# Patient Record
Sex: Male | Born: 1950 | Race: Black or African American | Hispanic: No | Marital: Single | Smoking: Never smoker
Health system: Southern US, Community
[De-identification: ages and names within clinical notes are randomized; demographics above are authoritative.]

## PROBLEM LIST (undated history)

## (undated) DIAGNOSIS — I10 Essential (primary) hypertension: Secondary | ICD-10-CM

## (undated) HISTORY — PX: PROSTATOTOMY: SUR1057

---

## 2005-03-20 ENCOUNTER — Emergency Department: Payer: Self-pay | Admitting: Emergency Medicine

## 2005-12-06 ENCOUNTER — Other Ambulatory Visit: Payer: Self-pay

## 2005-12-06 ENCOUNTER — Emergency Department: Payer: Self-pay | Admitting: Emergency Medicine

## 2005-12-07 ENCOUNTER — Other Ambulatory Visit: Payer: Self-pay

## 2008-07-26 ENCOUNTER — Emergency Department: Payer: Self-pay | Admitting: Emergency Medicine

## 2008-08-27 ENCOUNTER — Emergency Department: Payer: Self-pay | Admitting: Unknown Physician Specialty

## 2008-09-15 ENCOUNTER — Emergency Department: Payer: Self-pay | Admitting: Emergency Medicine

## 2008-09-17 ENCOUNTER — Emergency Department: Payer: Self-pay | Admitting: Emergency Medicine

## 2009-05-11 ENCOUNTER — Emergency Department: Payer: Self-pay | Admitting: Emergency Medicine

## 2009-05-30 ENCOUNTER — Emergency Department: Payer: Self-pay | Admitting: Emergency Medicine

## 2009-10-23 ENCOUNTER — Inpatient Hospital Stay: Payer: Self-pay | Admitting: Internal Medicine

## 2011-12-29 IMAGING — CT CT HEAD WITHOUT CONTRAST
2 series · 15 of 30 positions shown, 19 images · non-contrast
Comparison: none

REASON FOR EXAM: SYNCOPE
COMMENTS:   May transport without cardiac monitor

[Series 2: without · axial · non-contrast · 0.46mm/px · z∈[-180,-50]mm · 13 of 32 slices shown, 17 images]
[im 3/32  brain]
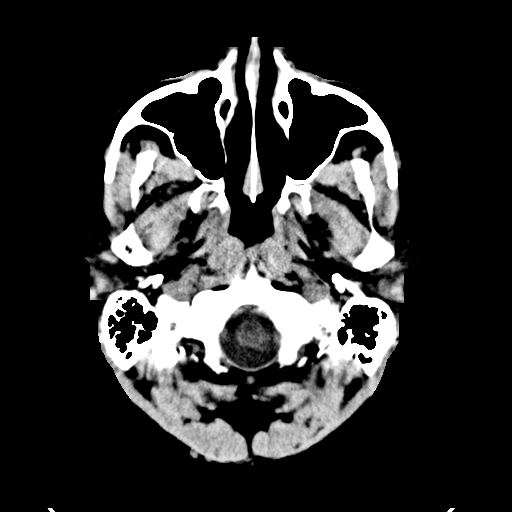
[im 3/32  bone]
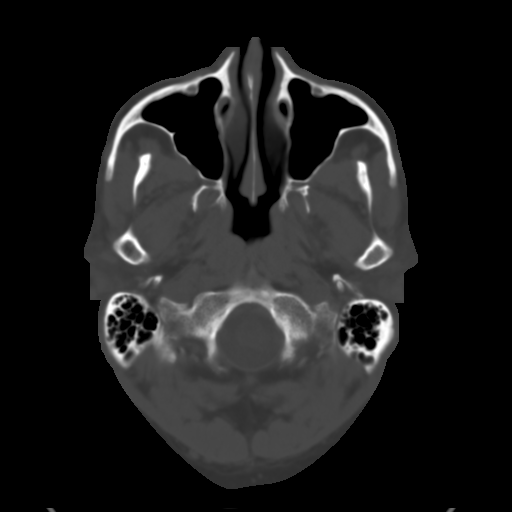
[im 5/32  brain]
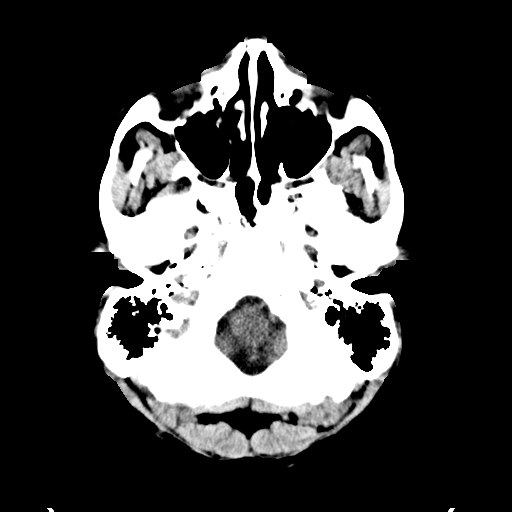
[im 7/32  brain]
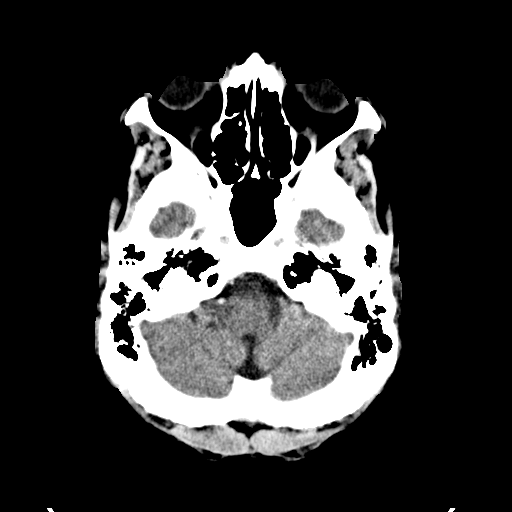
[im 9/32  brain]
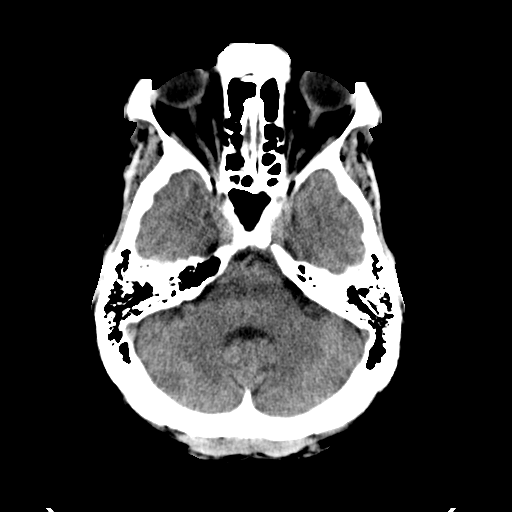
[im 12/32  brain]
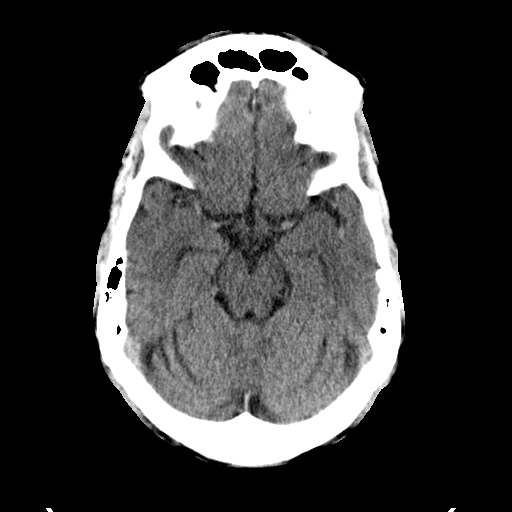
[im 12/32  bone]
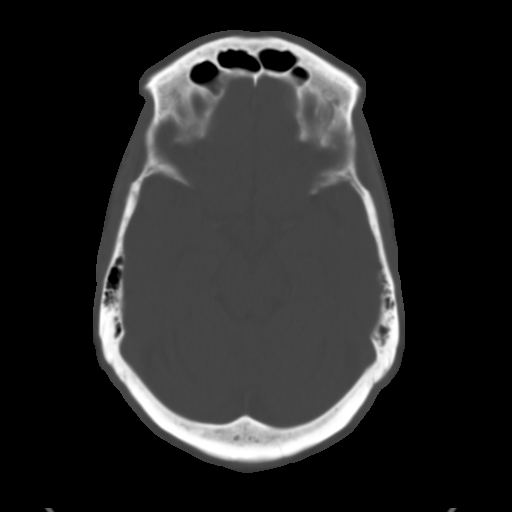
[im 14/32  brain]
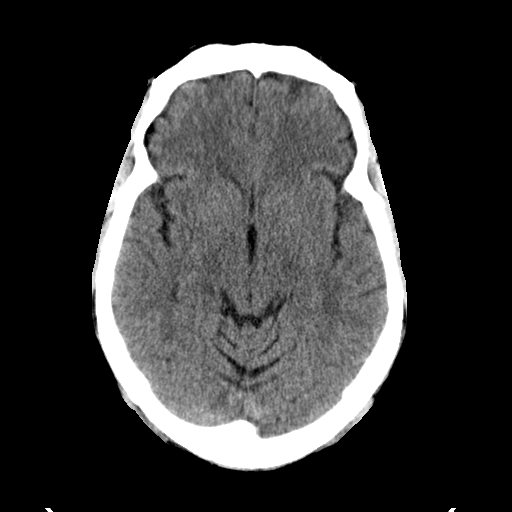
[im 16/32  brain]
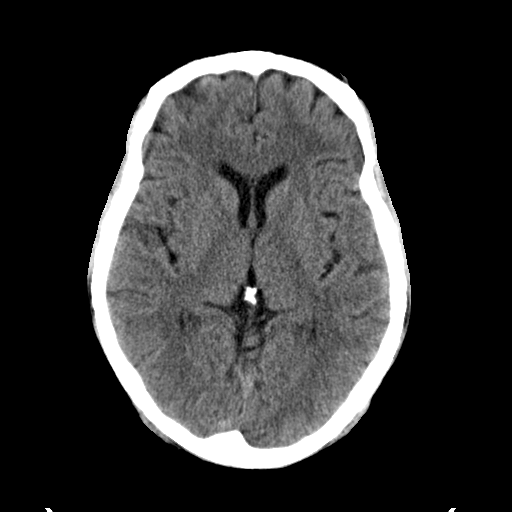
[im 18/32  brain]
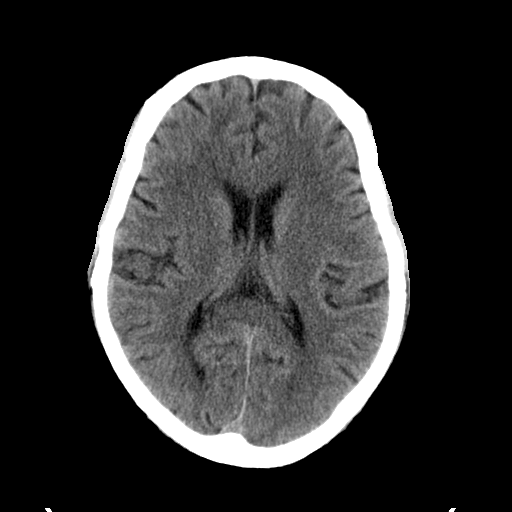
[im 20/32  brain]
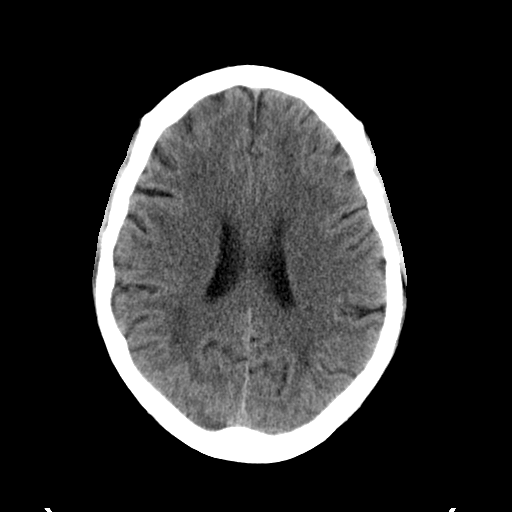
[im 20/32  bone]
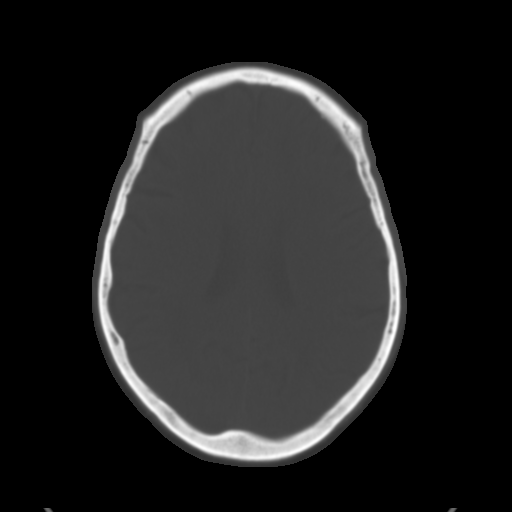
[im 23/32  brain]
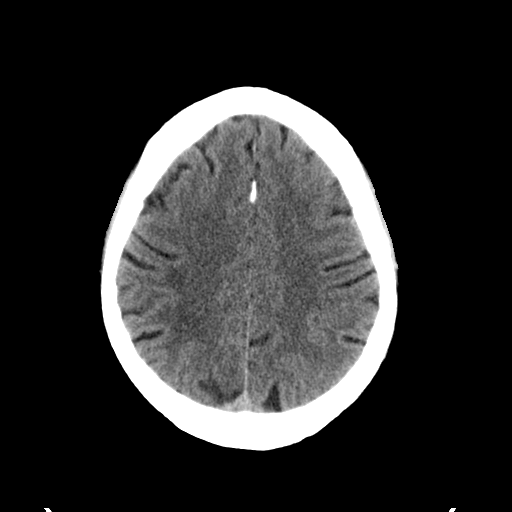
[im 25/32  brain]
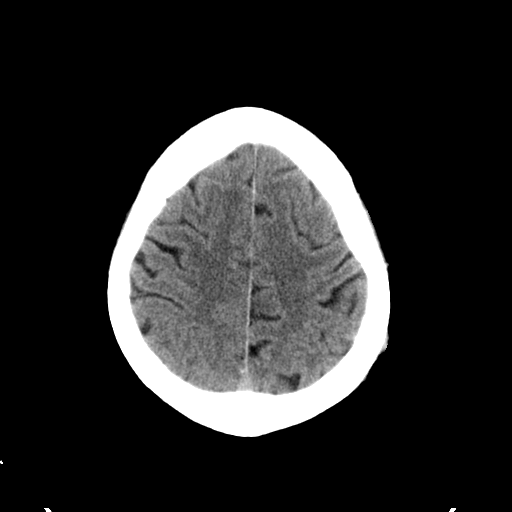
[im 27/32  brain]
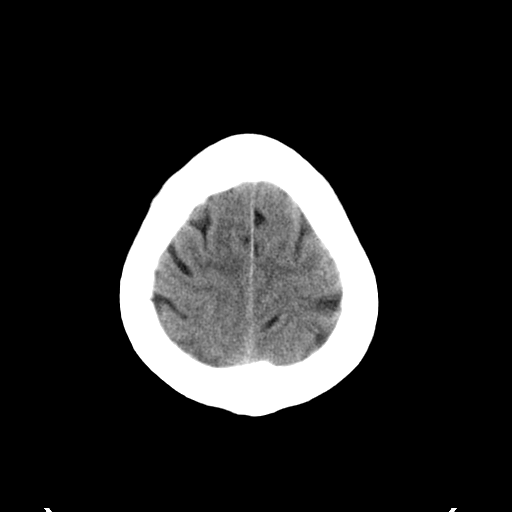
[im 29/32  brain]
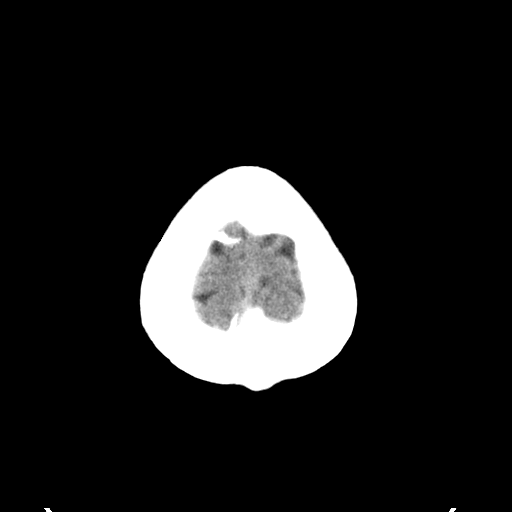
[im 29/32  bone]
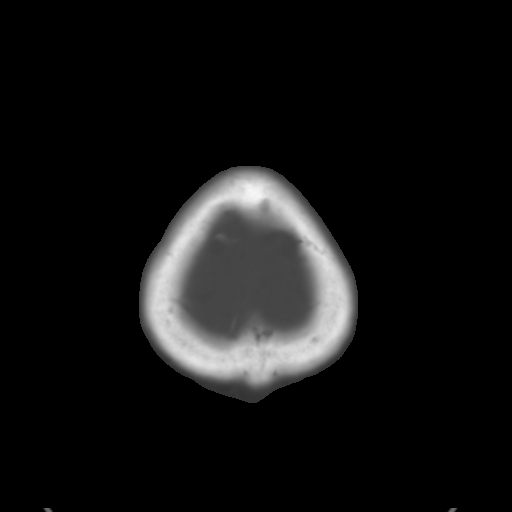

[Series 3: bone · axial · 0.46mm/px · z∈[-180,-160]mm · 2 of 32 slices shown]
[im 3/32  bone]
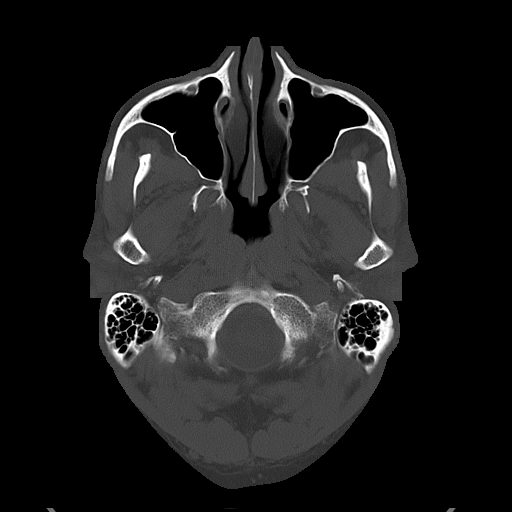
[im 7/32  bone]
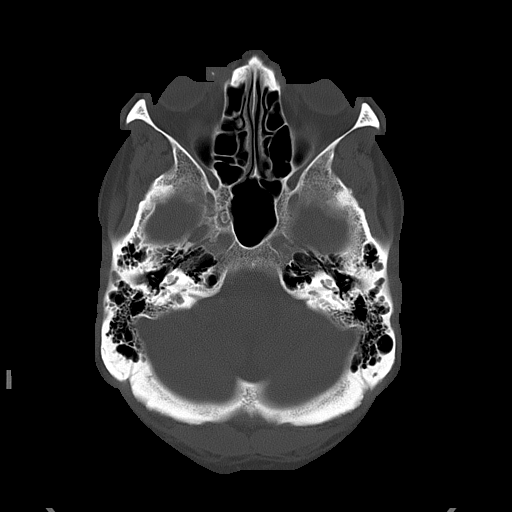

[15 of 30 positions shown; findings below may reference images not displayed]

PROCEDURE:     CT  - CT HEAD WITHOUT CONTRAST  - October 22, 2009 [DATE]

RESULT:     Axial noncontrast CT scanning was performed through the brain at
5 mm intervals and slice thicknesses.

The ventricles are normal in size and position. There is subtle decreased
density in the deep white matter of both posterior parietal lobes consistent
with small vessel ischemic type change. Milder but similar changes are noted
in the frontal deep white matter. There is no intracranial hemorrhage nor
intracranial mass effect. There are no findings to suggest an evolving
ischemic infarction. At bone window settings the observed portions of the
paranasal sinuses and mastoid air cells are clear. I do not see evidence of
an acute skull fracture.
IMPRESSION: 1. There is no evidence of an acute intracranial hemorrhage.
2. There is decreased density in the deep white matter of both cerebral
hemispheres as described consistent with chronic small vessel ischemic type
change.
3. I do not see evidence of an acute skull fracture nor other acute
abnormality.

## 2012-01-01 IMAGING — US US CAROTID DUPLEX BILAT
1 series · 17 of 24 positions shown · non-contrast
Comparison: none

REASON FOR EXAM: syncope
COMMENTS:

[Series 1: us carotid duplex bilat · 17 of 59 slices shown]
[im 1/59]
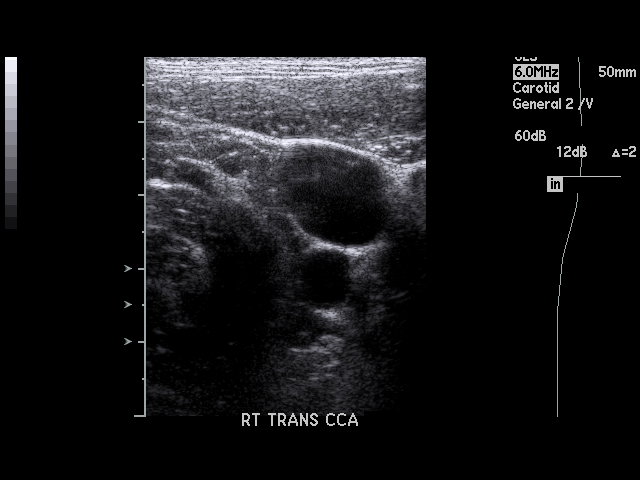
[im 6/59]
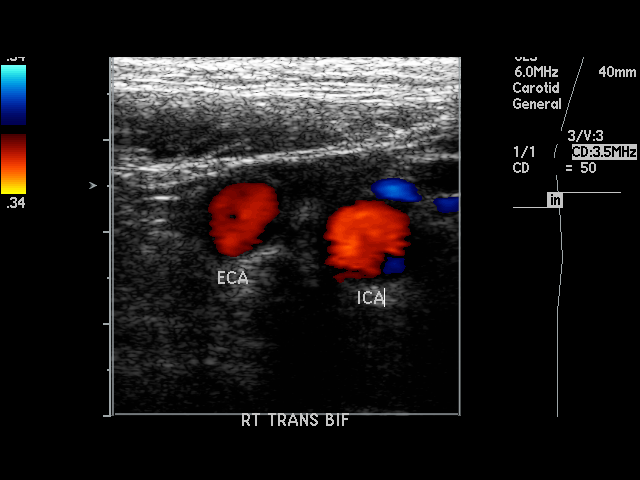
[im 8/59]
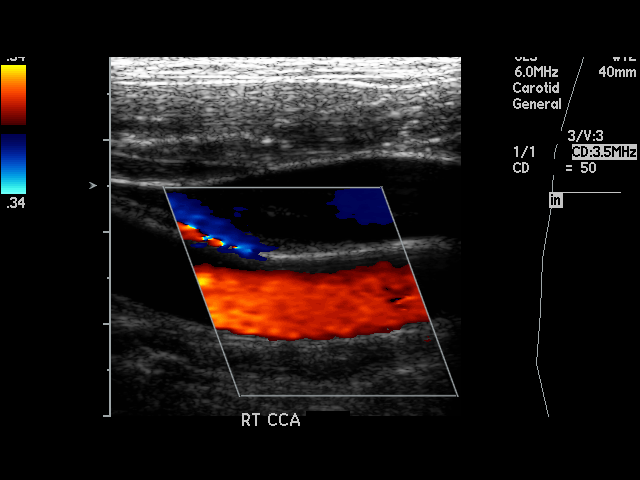
[im 11/59]
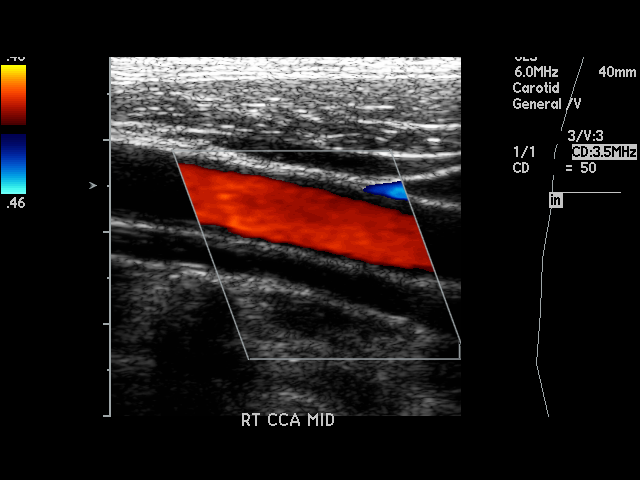
[im 16/59]
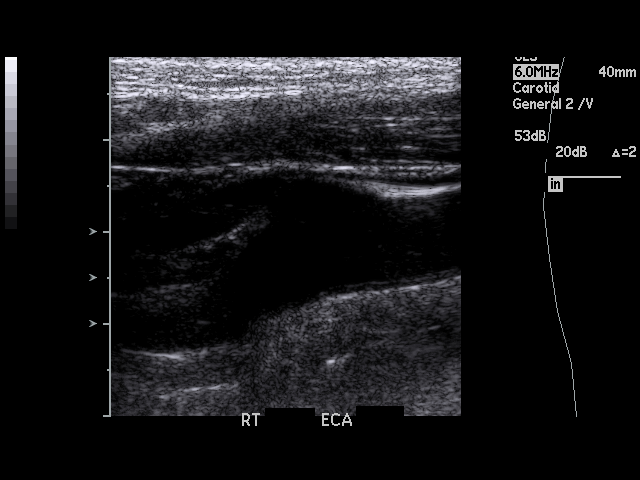
[im 18/59]
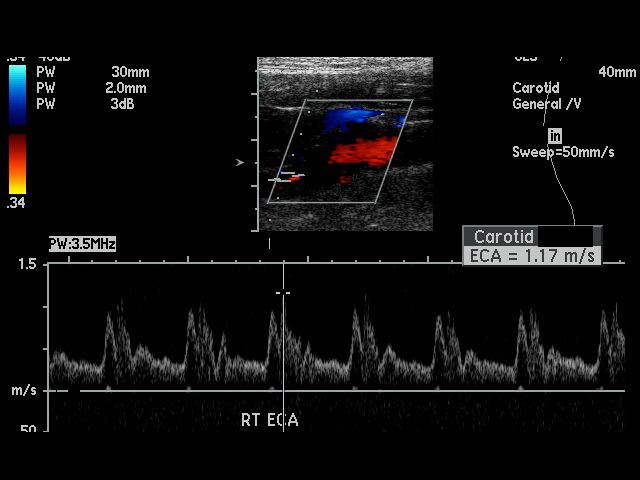
[im 23/59]
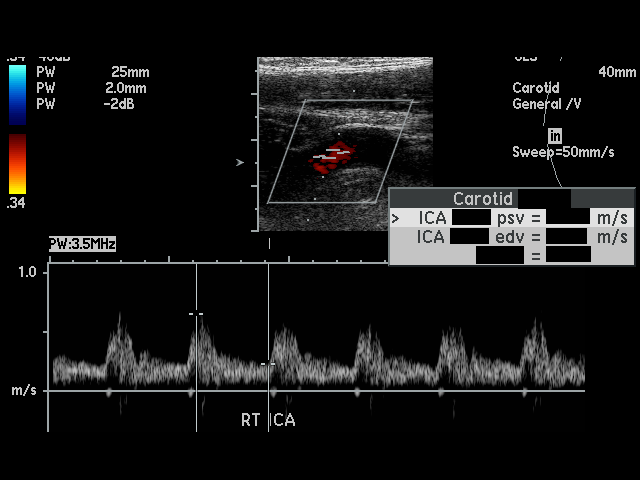
[im 26/59]
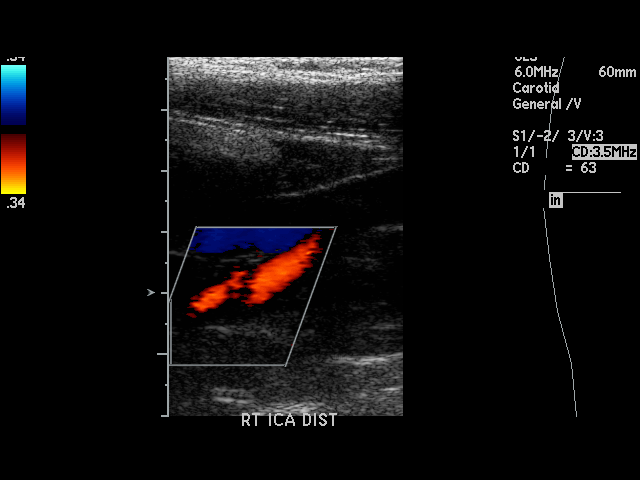
[im 31/59]
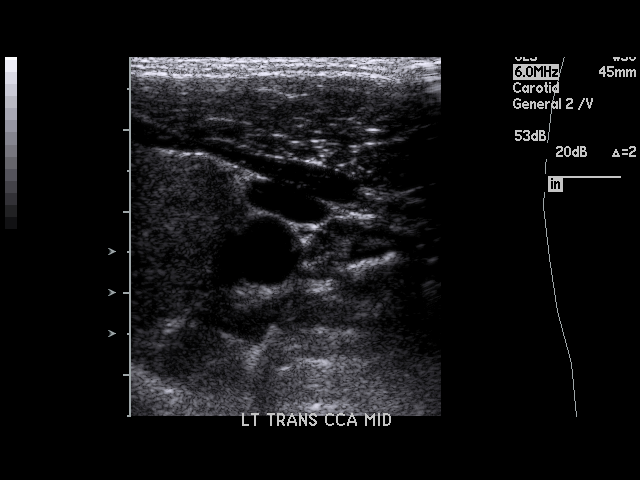
[im 33/59]
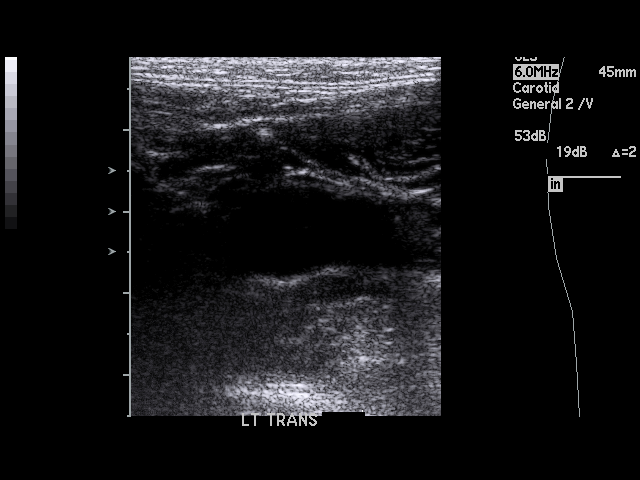
[im 36/59]
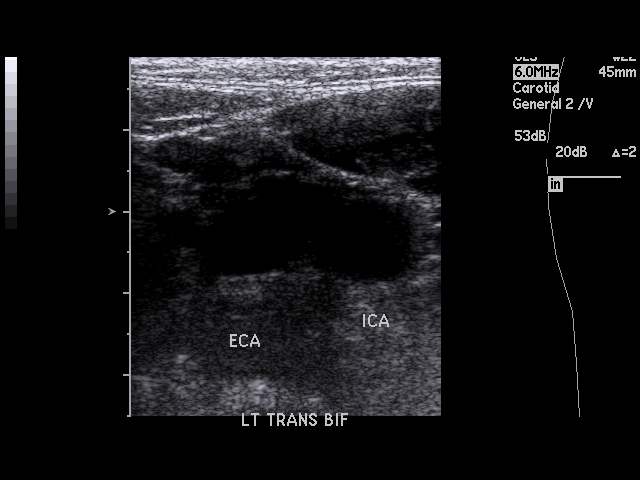
[im 41/59]
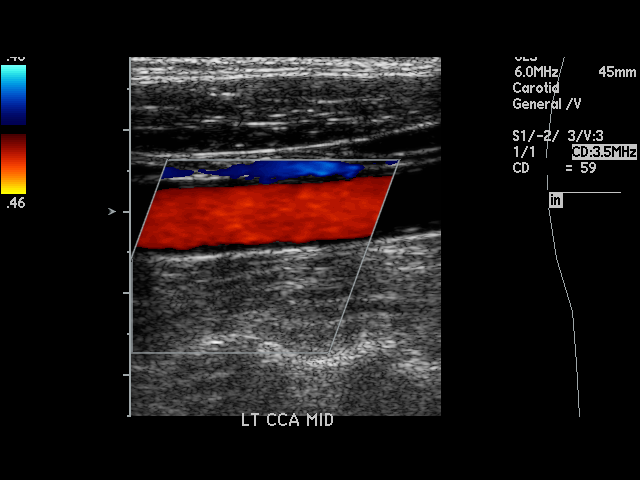
[im 43/59]
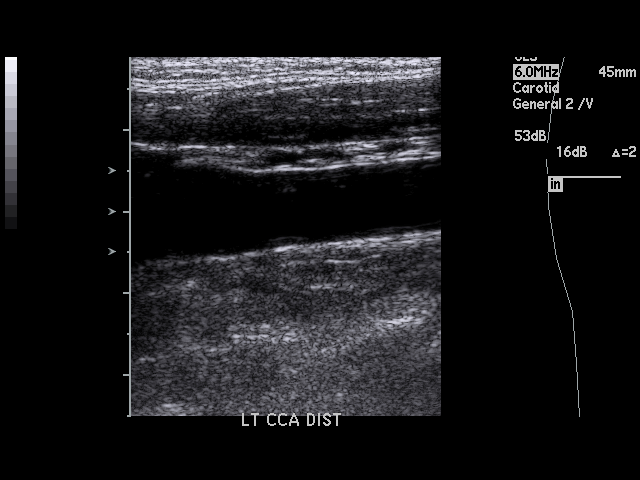
[im 48/59]
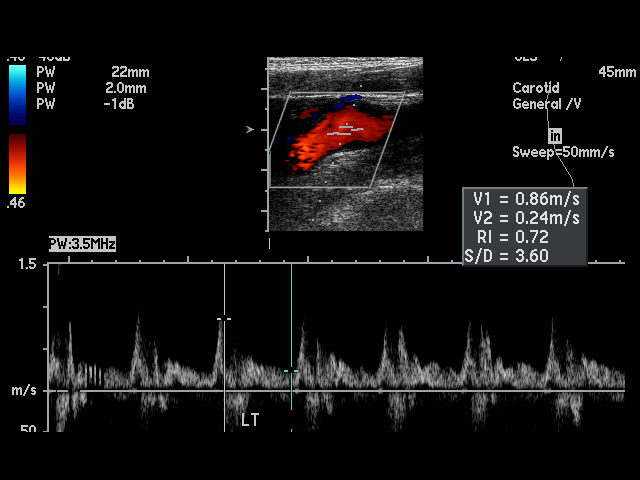
[im 51/59]
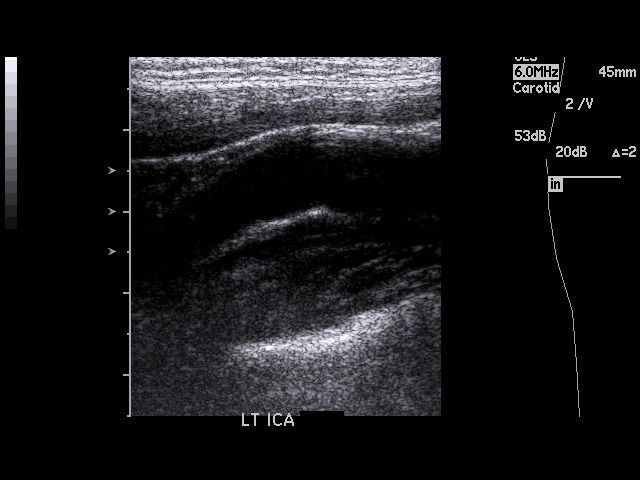
[im 53/59]
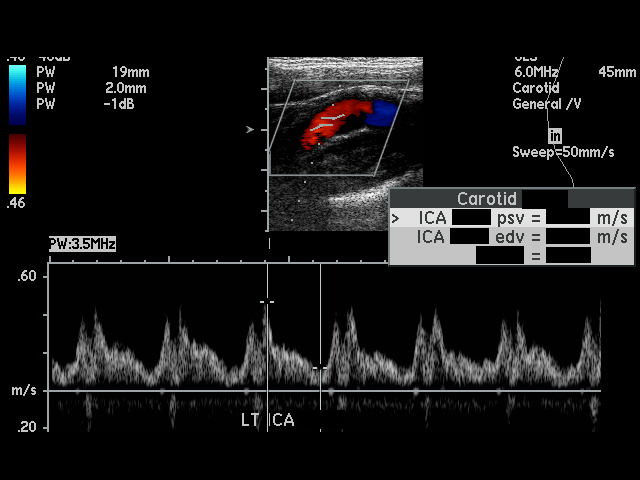
[im 59/59]
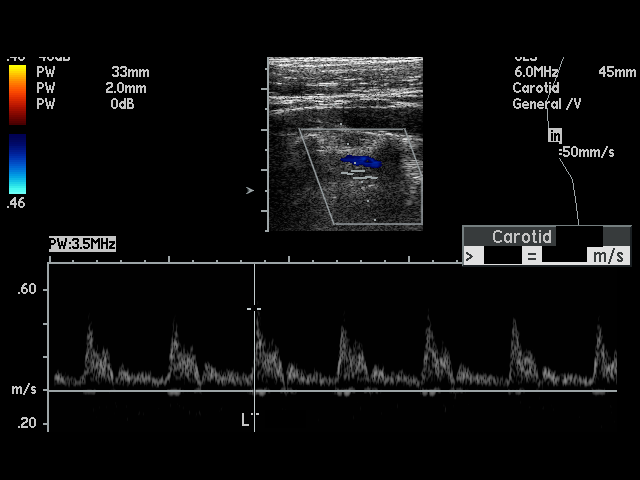

[17 of 24 positions shown; findings below may reference images not displayed]

PROCEDURE:     US  - US CAROTID DOPPLER BILATERAL  - October 25, 2009 [DATE]

RESULT:     Neither the right nor left carotid systems demonstrate calcified
or soft plaque. The color flow images and the waveform patterns are normal.
On the right peak internal carotid systolic velocity measured 77 cm per
second and peak common carotid velocity measured 104 cm per second
corresponding to a normal ratio of 0.7. On the left peak internal carotid
systolic velocity measured 72 cm per second and peak common carotid velocity
measured 125 cm per second corresponding to a ratio of 0.6. The vertebral
arteries are normal in flow direction bilaterally.
IMPRESSION: Normal carotid Doppler ultrasound examination.

## 2013-12-02 ENCOUNTER — Encounter (HOSPITAL_COMMUNITY): Payer: Self-pay | Admitting: Emergency Medicine

## 2013-12-02 DIAGNOSIS — N453 Epididymo-orchitis: Secondary | ICD-10-CM | POA: Insufficient documentation

## 2013-12-02 DIAGNOSIS — I10 Essential (primary) hypertension: Secondary | ICD-10-CM | POA: Insufficient documentation

## 2013-12-02 DIAGNOSIS — R509 Fever, unspecified: Secondary | ICD-10-CM | POA: Insufficient documentation

## 2013-12-02 NOTE — ED Notes (Signed)
Patient states his testicles have been swollen x 2 days.

## 2013-12-02 NOTE — ED Notes (Signed)
[facility].

## 2013-12-03 ENCOUNTER — Emergency Department (HOSPITAL_COMMUNITY)
Admission: EM | Admit: 2013-12-03 | Discharge: 2013-12-03 | Disposition: A | Discharge status: Home / Self Care | Attending: Emergency Medicine | Admitting: Emergency Medicine

## 2013-12-03 DIAGNOSIS — N451 Epididymitis: Secondary | ICD-10-CM

## 2013-12-03 HISTORY — DX: Essential (primary) hypertension: I10

## 2013-12-03 MED ORDER — CIPROFLOXACIN HCL 250 MG PO TABS
500.0000 mg | ORAL_TABLET | Freq: Two times a day (BID) | ORAL | Status: DC
  Administered 2013-12-03: 500 mg via ORAL
  Filled 2013-12-03: qty 2

## 2013-12-03 MED ORDER — OXYCODONE-ACETAMINOPHEN 5-325 MG PO TABS: 1.0000 | ORAL_TABLET | ORAL | Status: AC | PRN

## 2013-12-03 MED ORDER — OXYCODONE-ACETAMINOPHEN 5-325 MG PO TABS
1.0000 | ORAL_TABLET | Freq: Once | ORAL | Status: AC
  Administered 2013-12-03: 1 via ORAL
  Filled 2013-12-03: qty 1

## 2013-12-03 NOTE — ED Provider Notes (Signed)
CSN: [id number]     Arrival date & time 12/02/13  2304 History   First MD Initiated Contact with Patient 12/03/13 0255     Chief Complaint  Patient presents with  . Groin Swelling     (Consider location/radiation/quality/duration/timing/severity/associated sxs/prior Treatment) The history is provided by the patient.   63 year old male comes in with a painful swelling in his left testicle for the last 2 days. He has had subjective fever but no chills or sweats. There's been no dysuria. There is no radiation of pain in his abdomen or back. Pain is rated at 7/10. [facility] and is scheduled for surgery later today for this problem. He states that he had a small nodule in the testicle which have been treated but now the entire testicle is swollen.  Past Medical History  Diagnosis Date  . Hypertension    Past Surgical History  Procedure Laterality Date  . Prostatotomy     No family history on file. History  Substance Use Topics  . Smoking status: Never Smoker   . Smokeless tobacco: Not on file  . Alcohol Use: No    Review of Systems  All other systems reviewed and are negative.     Allergies  Aspirin  Home Medications   Current Outpatient Rx  Name  Route  Sig  Dispense  Refill  . oxyCODONE-acetaminophen (PERCOCET/ROXICET) 5-325 MG per tablet   Oral   Take 1 tablet by mouth every 4 (four) hours as needed for severe pain.   6 tablet   0    BP 133/68  Pulse 95  Temp(Src) 99.7 F (37.6 C) (Oral)  Resp 20  Ht 6\' 2"  (1.88 m)  Wt 235 lb (106.595 kg)  BMI 30.16 kg/m2  SpO2 99% Physical Exam  Nursing note and vitals reviewed.  63 year old male, resting comfortably and in no acute distress. Vital signs are normal. Oxygen saturation is 99%, which is normal. Head is normocephalic and atraumatic. PERRLA, EOMI. Oropharynx is clear. Neck is nontender and supple without adenopathy or JVD. Back is nontender and there is no CVA  tenderness. Lungs are clear without rales, wheezes, or rhonchi. Chest is nontender. Heart has regular rate and rhythm without murmur. Abdomen is soft, flat, nontender without masses or hepatosplenomegaly and peristalsis is normoactive. Genitalia: Circumcised penis. Right testicle is normal. Left testicle is indurated and moderately enlarged and very tender consistent with epididymitis. No other scrotal masses are seen. There is no inguinal adenopathy Extremities have no cyanosis or edema, full range of motion is present. Skin is warm and dry without rash. Neurologic: Mental status is normal, cranial nerves are intact, there are no motor or sensory deficits.  ED Course  Procedures (including critical care time)  MDM   Final diagnoses:  Epididymitis, left    Epididymitis of the left testicle. [facility] and he was seen in the urology clinic one month ago and treated for epididymitis with trimethoprim-sulfamethoxazole. At that time, followup appointment was set up for 3 months. Patient states that he got called requesting that the apartment be moved up and he has a scheduled appointment at 10:30 AM later today. If clinical picture of epididymitis and urology appointment scheduled for 8 hours from now, I do not see indication for any ED workup. He is given a dose of ciprofloxacin and is discharged with prescription for oxycodone-acetaminophen for pain in the us to see the urologist at 10:30 AM  as scheduled.    Dione Booze, MD 12/03/13 [phone]

## 2013-12-03 NOTE — Discharge Instructions (Signed)
[facility] your [facility].   Epididymitis Epididymitis is a swelling (inflammation) of the epididymis. The epididymis is a cord-like structure along the back part of the testicle. Epididymitis is usually, but not always, caused by infection. This is usually a sudden problem beginning with chills, fever and pain behind the scrotum and in the testicle. There may be swelling and redness of the testicle. DIAGNOSIS  Physical examination will reveal a tender, swollen epididymis. Sometimes, cultures are obtained from the urine or from prostate secretions to help find out if there is an infection or if the cause is a different problem. Sometimes, blood work is performed to see if your white blood cell count is elevated and if a germ (bacterial) or viral infection is present. Using this knowledge, an appropriate medicine which kills germs (antibiotic) can be chosen by your caregiver. A viral infection causing epididymitis will most often go away (resolve) without treatment. HOME CARE INSTRUCTIONS   Hot sitz baths for 20 minutes, 4 times per day, may help relieve pain.  Only take over-the-counter or prescription medicines for pain, discomfort or fever as directed by your caregiver.  Take all medicines, including antibiotics, as directed. Take the antibiotics for the full prescribed length of time even if you are feeling better.  It is very important to keep all follow-up appointments. SEEK IMMEDIATE MEDICAL CARE IF:   You have a fever.  You have pain not relieved with medicines.  You have any worsening of your problems.  Your pain seems to come and go.  You develop pain, redness, and swelling in the scrotum and surrounding areas. MAKE SURE YOU:   Understand these instructions.  Will watch your condition.  Will get help right away if you are not doing well or get worse. Document Released: 08/10/2000 Document Revised: 11/05/2011 Document Reviewed: 06/30/2009 [facility]  Patient Information 2014 [employer], [place].

## 2013-12-07 MED FILL — Oxycodone w/ Acetaminophen Tab 5-325 MG: ORAL | Qty: 6 | Status: AC
